# Patient Record
Sex: Male | Born: 1980 | Race: Black or African American | Hispanic: No
Health system: Southern US, Community
[De-identification: ages and names within clinical notes are randomized; demographics above are authoritative.]

---

## 2014-12-15 ENCOUNTER — Emergency Department
Admission: EM | Admit: 2014-12-15 | Discharge: 2014-12-15 | Disposition: A | Discharge status: Home / Self Care | Payer: 59 | Attending: Emergency Medicine | Admitting: Emergency Medicine

## 2014-12-15 ENCOUNTER — Emergency Department: Payer: 59

## 2014-12-15 DIAGNOSIS — R1031 Right lower quadrant pain: Secondary | ICD-10-CM | POA: Diagnosis not present

## 2014-12-15 DIAGNOSIS — K5900 Constipation, unspecified: Secondary | ICD-10-CM | POA: Diagnosis not present

## 2014-12-15 LAB — LIPASE, BLOOD: LIPASE: 38 U/L (ref 22–51)

## 2014-12-15 LAB — URINALYSIS COMPLETE WITH MICROSCOPIC (ARMC ONLY)
BILIRUBIN URINE: NEGATIVE
Bacteria, UA: NONE SEEN
GLUCOSE, UA: NEGATIVE mg/dL
Hgb urine dipstick: NEGATIVE
KETONES UR: NEGATIVE mg/dL
Leukocytes, UA: NEGATIVE
Nitrite: NEGATIVE
PROTEIN: NEGATIVE mg/dL
SQUAMOUS EPITHELIAL / LPF: NONE SEEN
Specific Gravity, Urine: 1.016 (ref 1.005–1.030)
WBC, UA: NONE SEEN WBC/hpf (ref 0–5)
pH: 6 (ref 5.0–8.0)

## 2014-12-15 LAB — CBC
HCT: 44.3 % (ref 40.0–52.0)
Hemoglobin: 15.1 g/dL (ref 13.0–18.0)
MCH: 31.1 pg (ref 26.0–34.0)
MCHC: 34.2 g/dL (ref 32.0–36.0)
MCV: 90.8 fL (ref 80.0–100.0)
PLATELETS: 160 10*3/uL (ref 150–440)
RBC: 4.87 MIL/uL (ref 4.40–5.90)
RDW: 12.7 % (ref 11.5–14.5)
WBC: 7.4 10*3/uL (ref 3.8–10.6)

## 2014-12-15 LAB — COMPREHENSIVE METABOLIC PANEL
ALK PHOS: 51 U/L (ref 38–126)
ALT: 14 U/L — AB (ref 17–63)
AST: 17 U/L (ref 15–41)
Albumin: 4.5 g/dL (ref 3.5–5.0)
Anion gap: 9 (ref 5–15)
BUN: 14 mg/dL (ref 6–20)
CALCIUM: 9.7 mg/dL (ref 8.9–10.3)
CHLORIDE: 104 mmol/L (ref 101–111)
CO2: 29 mmol/L (ref 22–32)
CREATININE: 1.29 mg/dL — AB (ref 0.61–1.24)
GFR calc Af Amer: >60 mL/min (ref >60)
GFR calc non Af Amer: >60 mL/min (ref >60)
GLUCOSE: 96 mg/dL (ref 65–99)
Potassium: 4 mmol/L (ref 3.5–5.1)
SODIUM: 142 mmol/L (ref 135–145)
Total Bilirubin: 0.6 mg/dL (ref 0.3–1.2)
Total Protein: 8.1 g/dL (ref 6.5–8.1)

## 2014-12-15 MED ORDER — IOHEXOL 300 MG/ML  SOLN
100.0000 mL | Freq: Once | INTRAMUSCULAR | Status: AC | PRN
  Administered 2014-12-15: 100 mL via INTRAVENOUS

## 2014-12-15 MED ORDER — IOHEXOL 240 MG/ML SOLN
25.0000 mL | Freq: Once | INTRAMUSCULAR | Status: AC | PRN
  Administered 2014-12-15: 25 mL via ORAL

## 2014-12-15 MED ORDER — ONDANSETRON HCL 4 MG/2ML IJ SOLN
4.0000 mg | Freq: Once | INTRAMUSCULAR | Status: AC
  Administered 2014-12-15: 4 mg via INTRAVENOUS
  Filled 2014-12-15: qty 2

## 2014-12-15 MED ORDER — BISACODYL 5 MG PO TBEC: 10.0000 mg | DELAYED_RELEASE_TABLET | Freq: Every day | ORAL | Status: AC | PRN

## 2014-12-15 NOTE — Discharge Instructions (Signed)

## 2014-12-15 NOTE — ED Provider Notes (Signed)
[facility] Emergency Department Provider Note  ____________________________________________  Time seen: 12 PM  I have reviewed the triage vital signs and the nursing notes.   HISTORY  Chief Complaint Abdominal Pain    HPI Eric Daugherty is a 34 y.o. male who presents with complaints of abdominal pain that started this morning. He reports the pain is in the lower abdomen primarily on the right side. He does report a long history of similar discomfort typically occurs in the morning but will resolve after about an hour. This time it is not resolving. In the past he has tried avoiding glutens and lactulose without any benefit. He denies vomiting. No fevers no chills. No dysuria. He does have occasional bouts of constipation but not right now. The pain does not travel anywhere., The pain is moderate to severe     History reviewed. No pertinent past medical history.  There are no active problems to display for this patient.   History reviewed. No pertinent past surgical history.  No current outpatient prescriptions on file.  Allergies Review of patient's allergies indicates no known allergies.  No family history on file.  Social History Social History  Substance Use Topics  . Smoking status: Never Smoker   . Smokeless tobacco: Never Used  . Alcohol Use: No    Review of Systems  Constitutional: Negative for fever. Eyes: Negative for visual changes. ENT: Negative for sore throat Cardiovascular: Negative for chest pain. Respiratory: Negative for shortness of breath. Gastrointestinal: Positive for abdominal pain Genitourinary: Negative for dysuria. Musculoskeletal: Negative for back pain. Skin: Negative for rash. Neurological: Negative for headaches or focal weakness Psychiatric: No anxiety    ____________________________________________   PHYSICAL EXAM:  VITAL SIGNS: ED Triage Vitals  Enc Vitals Group     BP 12/15/14 1039 146/85 mmHg     Pulse Rate 12/15/14 1039 69     Resp 12/15/14 1039 16     Temp 12/15/14 1037 98.1 F (36.7 C)     Temp Source 12/15/14 1037 Oral     SpO2 12/15/14 1039 99 %     Weight 12/15/14 1039 200 lb (90.719 kg)     Height 12/15/14 1039 6' (1.829 m)     Head Cir --      Peak Flow --      Pain Score 12/15/14 1040 6     Pain Loc --      Pain Edu? --      Excl. in GC? --      Constitutional: Alert and oriented. Well appearing and in no distress. Eyes: Conjunctivae are normal.  ENT   Head: Normocephalic and atraumatic.   Mouth/Throat: Mucous membranes are moist. Cardiovascular: Normal rate, regular rhythm. Normal and symmetric distal pulses are present in all extremities. No murmurs, rubs, or gallops. Respiratory: Normal respiratory effort without tachypnea nor retractions. Breath sounds are clear and equal bilaterally.  Gastrointestinal: Mild tenderness to palpation in the right lower quadrant especially. No distention. There is no CVA tenderness. Genitourinary: deferred Musculoskeletal: Nontender with normal range of motion in all extremities. No lower extremity tenderness nor edema. Neurologic:  Normal speech and language. No gross focal neurologic deficits are appreciated. Skin:  Skin is warm, dry and intact. No rash noted. Psychiatric: Mood and affect are normal. Patient exhibits appropriate insight and judgment.  ____________________________________________    LABS (pertinent positives/negatives)  Labs Reviewed  URINALYSIS COMPLETEWITH MICROSCOPIC ([facility] ONLY) - Abnormal; Notable for the following:    Color, Urine YELLOW (*)  APPearance CLEAR (*)    All other components within normal limits  CBC  LIPASE, BLOOD  COMPREHENSIVE METABOLIC PANEL    ____________________________________________   EKG  None  ____________________________________________    RADIOLOGY I have personally reviewed any xrays that were ordered on this patient: CT scan shows no acute  abnormalities  ____________________________________________   PROCEDURES  Procedure(s) performed: none  Critical Care performed: none  ____________________________________________   INITIAL IMPRESSION / ASSESSMENT AND PLAN / ED COURSE  Pertinent labs & imaging results that were available during my care of the patient were reviewed by me and considered in my medical decision making (see chart for details).  Patient presents with lower abdominal pain. He reports is similar to previous events that he has had many times before but lasting longer than normal. We will check labs and consider imaging. We will give IV anti-medics and pain medication  ----------------------------------------- 2:43 PM on 12/15/2014 -----------------------------------------  Patient CT scan shows significant constipation but no other abnormality. This could very well be a source of his pain. I'll prescribe laxative's and have him follow up with GI as needed and I discussed return precautions with him  ____________________________________________   FINAL CLINICAL IMPRESSION(S) / ED DIAGNOSES  Final diagnoses:  Right lower quadrant abdominal pain   constipation   Jene Every, MD 12/15/14 1521

## 2014-12-15 NOTE — ED Notes (Signed)
Patient transported to CT 

## 2014-12-15 NOTE — ED Notes (Signed)
Family at bedside. 

## 2014-12-15 NOTE — ED Notes (Signed)
Pt c/o mid/lower abd pain since this morning, denies N/V/D.Marland Kitchenstates he has a hx of intermittent abd cramping with diarrhea for 2 years and thinks it is just IBS or gluten intolerance.Marland Kitchen

## 2016-05-16 DIAGNOSIS — J029 Acute pharyngitis, unspecified: Secondary | ICD-10-CM | POA: Diagnosis not present

## 2016-06-05 DIAGNOSIS — B3749 Other urogenital candidiasis: Secondary | ICD-10-CM | POA: Diagnosis not present

## 2016-09-09 IMAGING — CT CT ABD-PELV W/ CM
1 of 2 series · 15 of 32 positions shown, 19 images · IV contrast (omnipaque)
Comparison: None.

CLINICAL DATA: Mid and lower abdomen pain since this morning.

EXAM:
CT ABDOMEN AND PELVIS WITH CONTRAST
TECHNIQUE: Multidetector CT imaging of the abdomen and pelvis was performed
using the standard protocol following bolus administration of
intravenous contrast.
CONTRAST:  100mL OMNIPAQUE IOHEXOL 300 MG/ML  SOLN

[Series 2: routine abd pel with · axial · 0.80mm/px · z∈[-262,+158]mm · 15 of 92 slices shown, 19 images]
[im 4/92  soft-tissue]
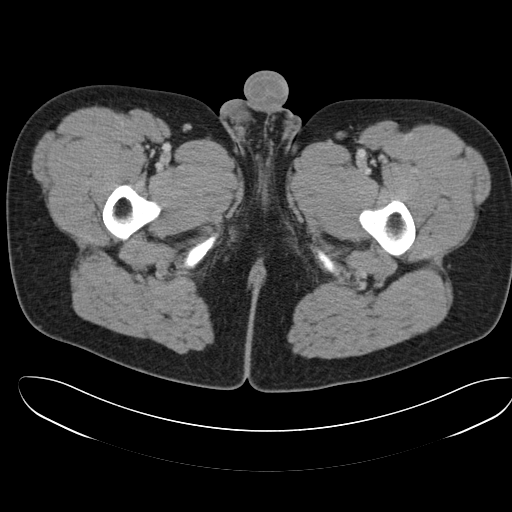
[im 4/92  bone]
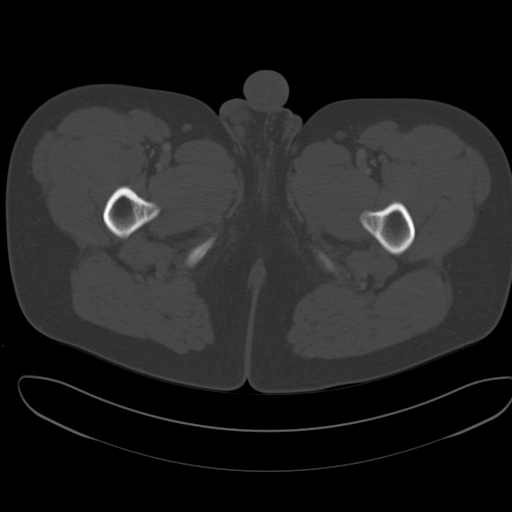
[im 11/92  soft-tissue]
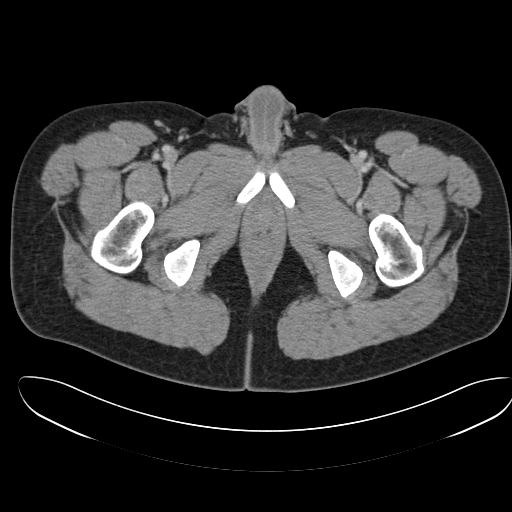
[im 19/92  soft-tissue]
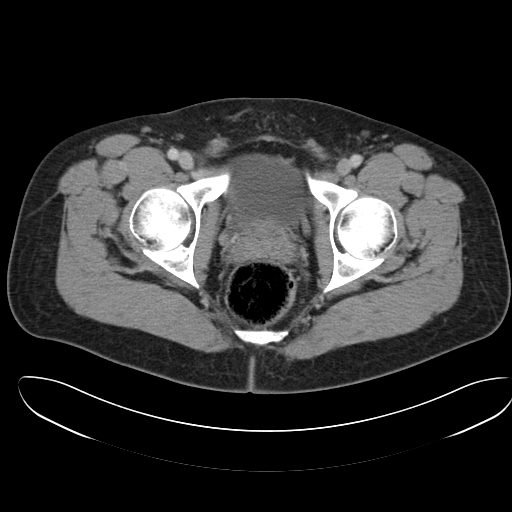
[im 26/92  soft-tissue]
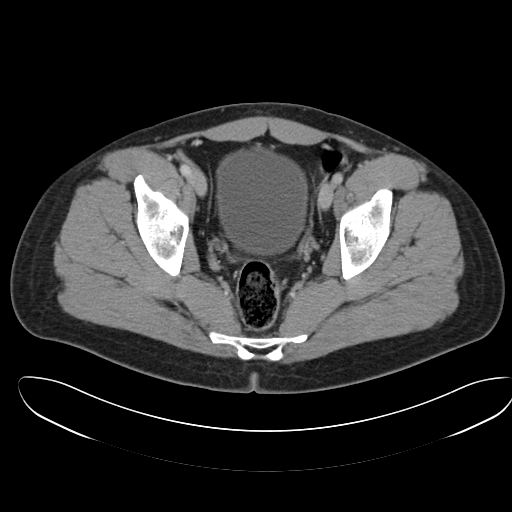
[im 33/92  soft-tissue]
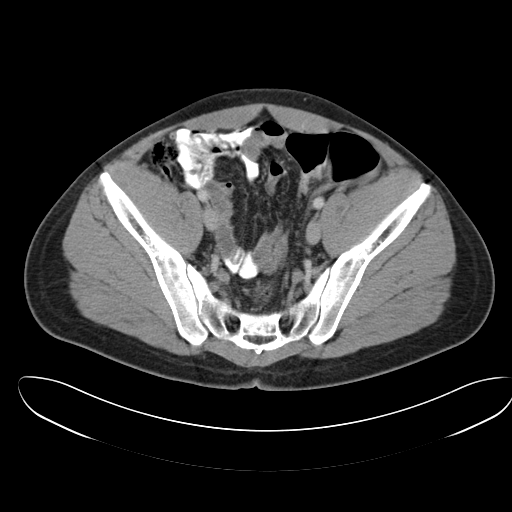
[im 41/92  soft-tissue]
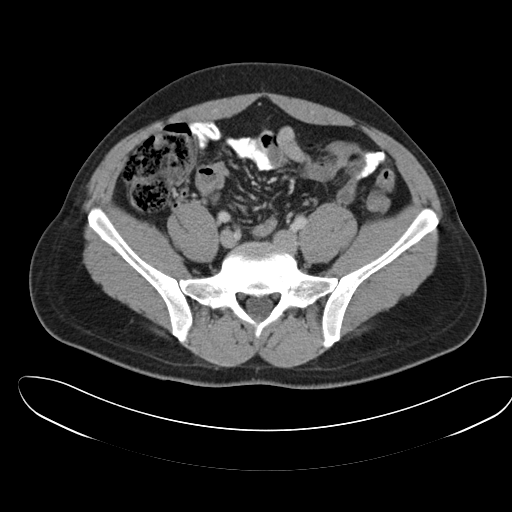
[im 48/92  soft-tissue]
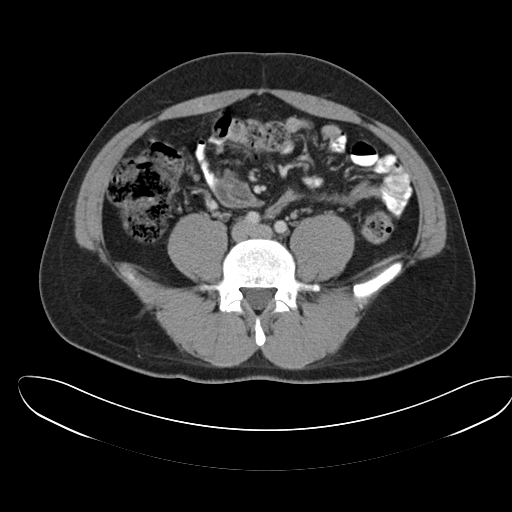
[im 51/92  soft-tissue]
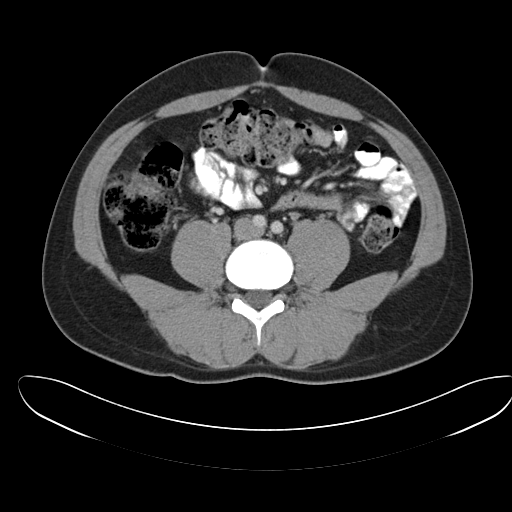
[im 59/92  soft-tissue]
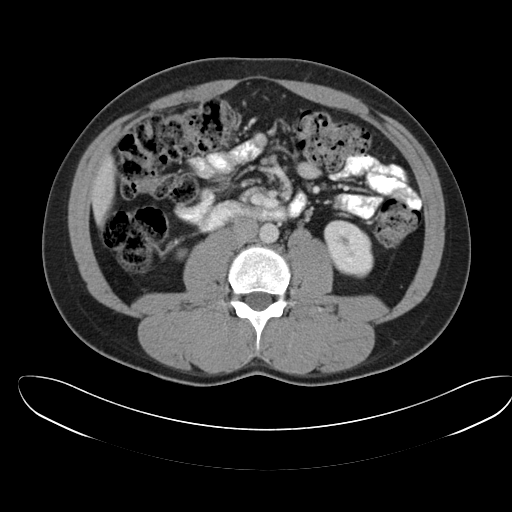
[im 59/92  bone]
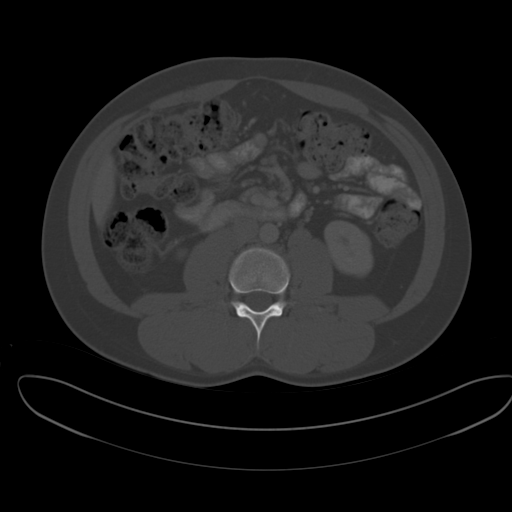
[im 66/92  soft-tissue]
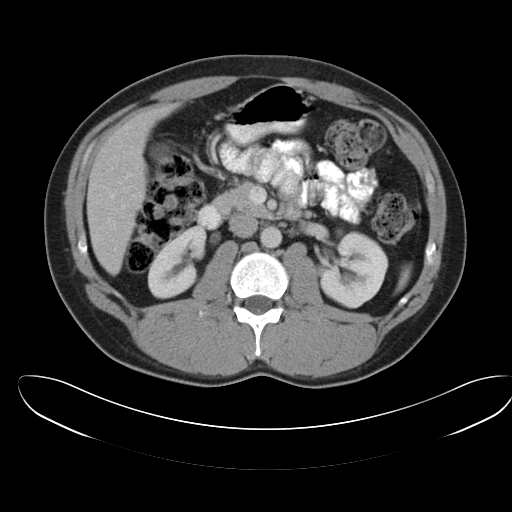
[im 73/92  soft-tissue]
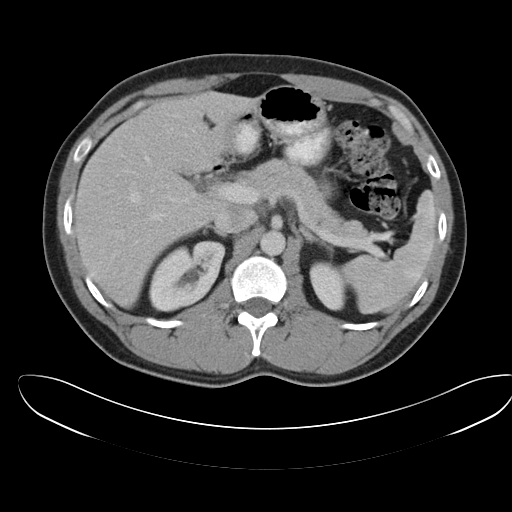
[im 77/92  lung]
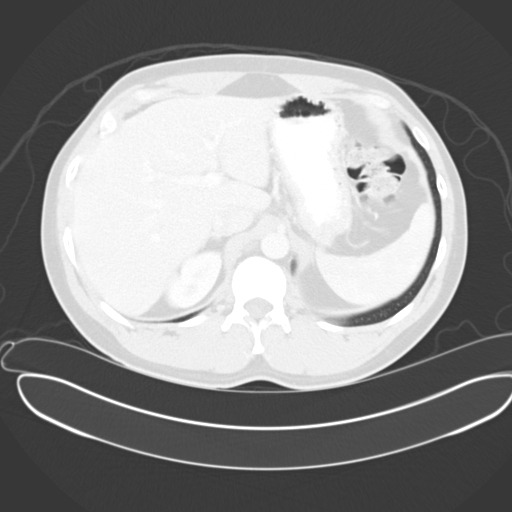
[im 81/92  soft-tissue]
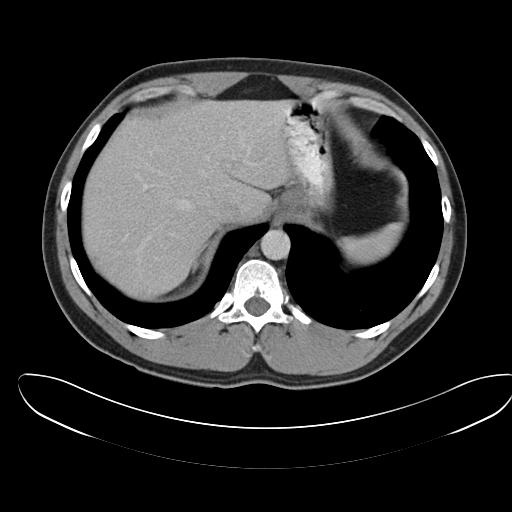
[im 81/92  lung]
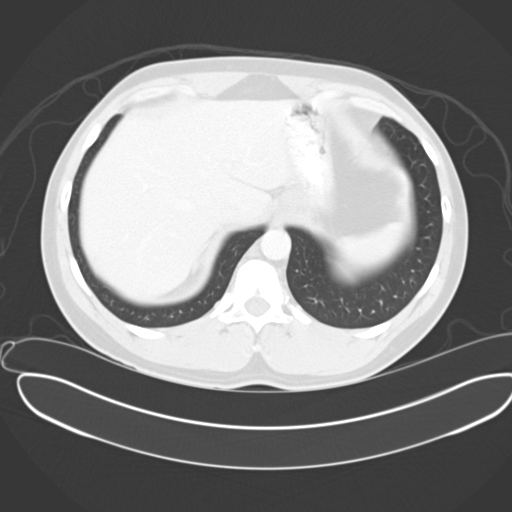
[im 84/92  lung]
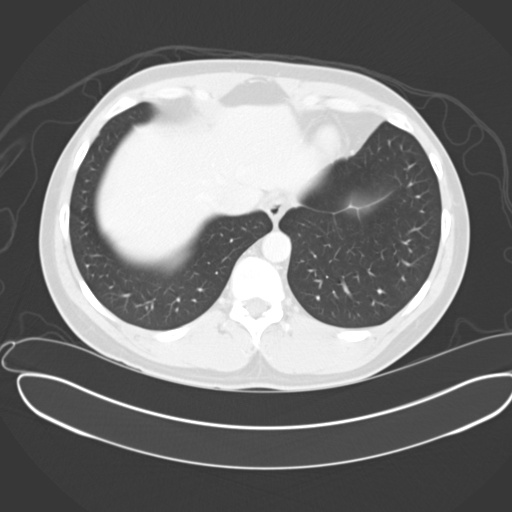
[im 88/92  soft-tissue]
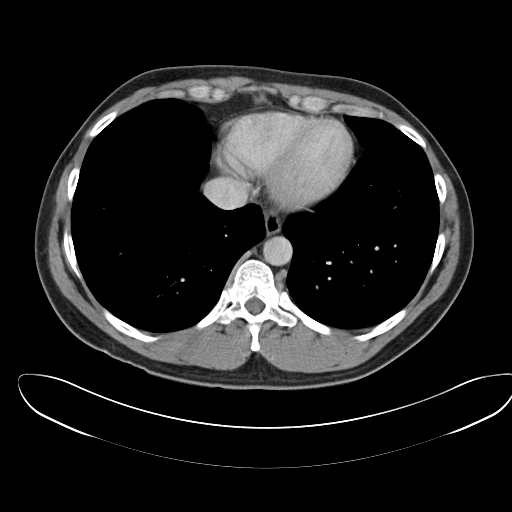
[im 88/92  lung]
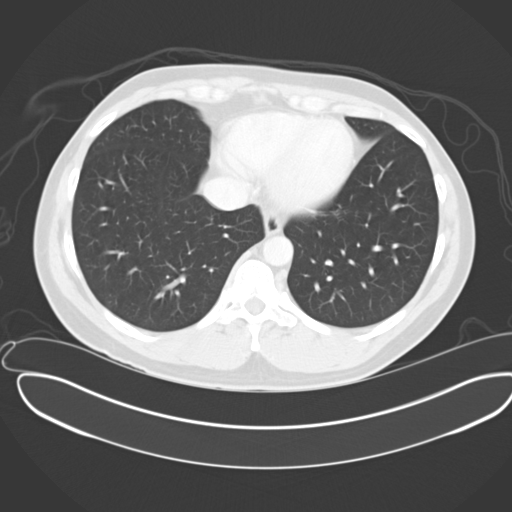

[15 of 32 positions shown; findings below may reference images not displayed]

FINDINGS: The liver, spleen, pancreas, gallbladder, adrenal glands and kidneys
are normal. There is no hydronephrosis bilaterally. The aorta is
normal. There is no abdominal lymphadenopathy.

There is no small bowel obstruction or diverticulitis. Extensive
bowel content is identified throughout colon. The appendix is
normal.

Fluid-filled bladder is normal. The prostate gland is normal. The
lung bases are clear. No acute abnormality is identified within the
visualized bones.
IMPRESSION: No acute abnormality identified in the abdomen and pelvis. No
evidence of appendicitis or hydronephrosis bilaterally. No bowel
obstruction.

Constipation.

## 2017-06-07 DIAGNOSIS — Z7689 Persons encountering health services in other specified circumstances: Secondary | ICD-10-CM | POA: Diagnosis not present

## 2017-06-08 DIAGNOSIS — Z7689 Persons encountering health services in other specified circumstances: Secondary | ICD-10-CM | POA: Diagnosis not present

## 2017-06-15 DIAGNOSIS — Z0000 Encounter for general adult medical examination without abnormal findings: Secondary | ICD-10-CM | POA: Diagnosis not present

## 2017-06-15 DIAGNOSIS — K589 Irritable bowel syndrome without diarrhea: Secondary | ICD-10-CM | POA: Diagnosis not present

## 2017-06-28 DIAGNOSIS — J029 Acute pharyngitis, unspecified: Secondary | ICD-10-CM | POA: Diagnosis not present

## 2017-06-28 DIAGNOSIS — J069 Acute upper respiratory infection, unspecified: Secondary | ICD-10-CM | POA: Diagnosis not present

## 2017-10-18 DIAGNOSIS — R21 Rash and other nonspecific skin eruption: Secondary | ICD-10-CM | POA: Diagnosis not present
